# Patient Record
Sex: Male | Born: 1955 | Race: White | Hispanic: No | Marital: Married | Smoking: Never smoker
Health system: Southern US, Community
[De-identification: ages and names within clinical notes are randomized; demographics above are authoritative.]

## PROBLEM LIST (undated history)

## (undated) DIAGNOSIS — I10 Essential (primary) hypertension: Secondary | ICD-10-CM

## (undated) HISTORY — DX: Essential (primary) hypertension: I10

---

## 2004-08-15 ENCOUNTER — Ambulatory Visit: Payer: Self-pay | Admitting: Gastroenterology

## 2004-09-23 ENCOUNTER — Inpatient Hospital Stay (HOSPITAL_COMMUNITY): Admission: RE | Admit: 2004-09-23 | Discharge: 2004-09-25 | Payer: Self-pay | Admitting: Orthopedic Surgery

## 2005-10-06 HISTORY — PX: REPLACEMENT TOTAL KNEE: SUR1224

## 2009-01-30 ENCOUNTER — Ambulatory Visit (HOSPITAL_COMMUNITY): Admission: RE | Admit: 2009-01-30 | Discharge: 2009-01-30 | Payer: Self-pay | Admitting: General Surgery

## 2009-02-15 ENCOUNTER — Encounter: Admission: RE | Admit: 2009-02-15 | Discharge: 2009-02-15 | Payer: Self-pay | Admitting: General Surgery

## 2009-09-18 IMAGING — CR DG CHEST 2V
2 series · 2 of 2 positions shown · non-contrast
Comparison: September 17, 2004

CLINICAL DATA: Obesity; preoperative screening for bariatric
procedure

CHEST - 2 VIEW

[view not recorded (1 of 2)]
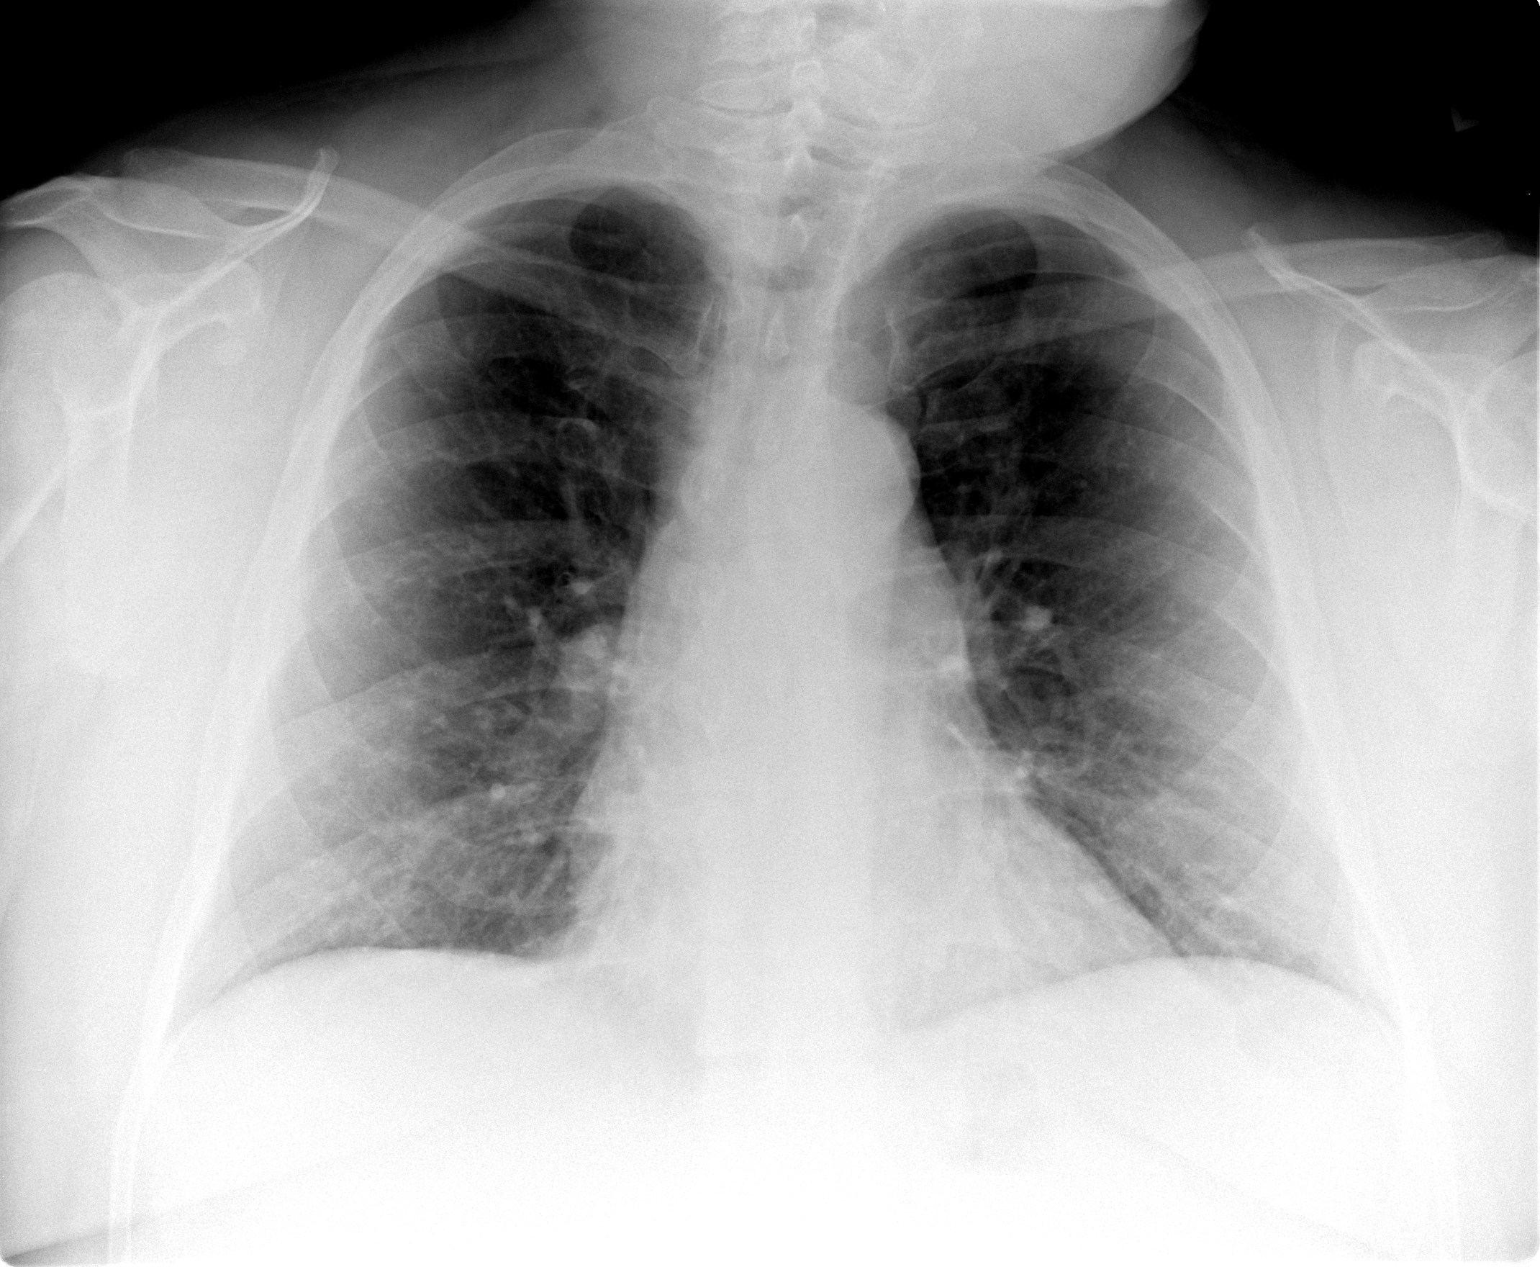

[view not recorded (2 of 2)]
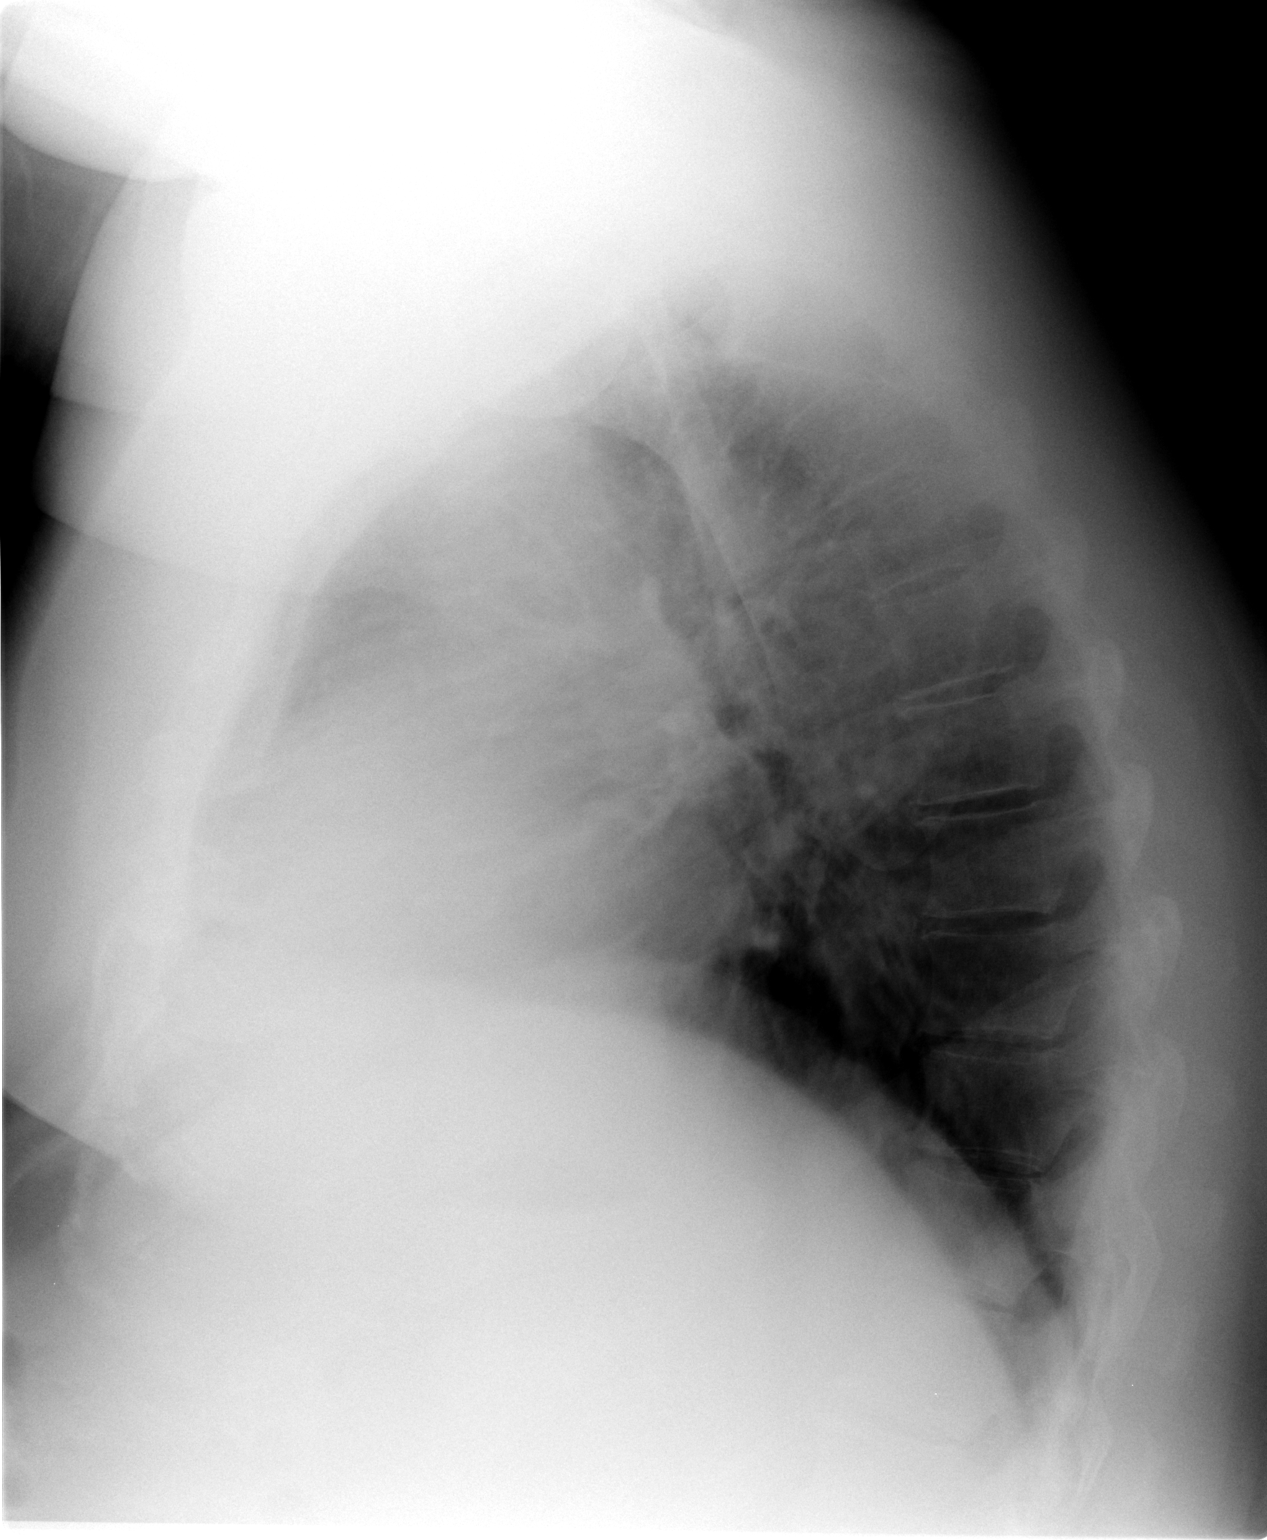

[2 of 2 positions shown; findings below may reference images not displayed]

FINDINGS: The cardiac silhouette, mediastinum, pulmonary
vasculature are within normal limits.  Both lungs are clear.  The
osseous structures are unremarkable.
IMPRESSION: Stable chest x-ray with no evidence of acute cardiac or pulmonary
process.

## 2010-09-05 ENCOUNTER — Encounter
Admission: RE | Admit: 2010-09-05 | Discharge: 2010-11-05 | Payer: Self-pay | Source: Home / Self Care | Attending: General Surgery | Admitting: General Surgery

## 2010-09-17 ENCOUNTER — Ambulatory Visit (HOSPITAL_COMMUNITY)
Admission: RE | Admit: 2010-09-17 | Discharge: 2010-09-17 | Payer: Self-pay | Source: Home / Self Care | Attending: General Surgery | Admitting: General Surgery

## 2010-09-17 HISTORY — PX: LAPAROSCOPIC GASTRIC BANDING: SHX1100

## 2010-09-24 ENCOUNTER — Encounter
Admission: RE | Admit: 2010-09-24 | Discharge: 2010-11-05 | Payer: Self-pay | Source: Home / Self Care | Attending: General Surgery | Admitting: General Surgery

## 2010-11-07 ENCOUNTER — Ambulatory Visit: Payer: Self-pay | Admitting: *Deleted

## 2010-11-18 ENCOUNTER — Encounter: Payer: 59 | Attending: General Surgery | Admitting: *Deleted

## 2010-11-18 DIAGNOSIS — Z09 Encounter for follow-up examination after completed treatment for conditions other than malignant neoplasm: Secondary | ICD-10-CM | POA: Insufficient documentation

## 2010-11-18 DIAGNOSIS — Z713 Dietary counseling and surveillance: Secondary | ICD-10-CM | POA: Insufficient documentation

## 2010-11-18 DIAGNOSIS — Z9884 Bariatric surgery status: Secondary | ICD-10-CM | POA: Insufficient documentation

## 2010-12-17 LAB — DIFFERENTIAL
Basophils Absolute: 0 10*3/uL (ref 0.0–0.1)
Basophils Relative: 0 % (ref 0–1)
Eosinophils Absolute: 0.2 10*3/uL (ref 0.0–0.7)
Eosinophils Relative: 3 % (ref 0–5)
Lymphocytes Relative: 44 % (ref 12–46)
Lymphs Abs: 2.7 10*3/uL (ref 0.7–4.0)
Monocytes Absolute: 0.5 10*3/uL (ref 0.1–1.0)
Monocytes Relative: 8 % (ref 3–12)
Neutro Abs: 2.8 10*3/uL (ref 1.7–7.7)
Neutrophils Relative %: 45 % (ref 43–77)

## 2010-12-17 LAB — COMPREHENSIVE METABOLIC PANEL
ALT: 174 U/L — ABNORMAL HIGH (ref 0–53)
AST: 120 U/L — ABNORMAL HIGH (ref 0–37)
Albumin: 3.8 g/dL (ref 3.5–5.2)
Alkaline Phosphatase: 64 U/L (ref 39–117)
BUN: 13 mg/dL (ref 6–23)
CO2: 30 mEq/L (ref 19–32)
Calcium: 9.2 mg/dL (ref 8.4–10.5)
Chloride: 101 mEq/L (ref 96–112)
Creatinine, Ser: 0.94 mg/dL (ref 0.4–1.5)
GFR calc Af Amer: >60 mL/min (ref >60)
GFR calc non Af Amer: >60 mL/min (ref >60)
Glucose, Bld: 90 mg/dL (ref 70–99)
Potassium: 4.4 mEq/L (ref 3.5–5.1)
Sodium: 140 mEq/L (ref 135–145)
Total Bilirubin: 0.7 mg/dL (ref 0.3–1.2)
Total Protein: 7.4 g/dL (ref 6.0–8.3)

## 2010-12-17 LAB — SURGICAL PCR SCREEN
MRSA, PCR: NEGATIVE
Staphylococcus aureus: NEGATIVE

## 2010-12-17 LAB — CBC
HCT: 43.6 % (ref 39.0–52.0)
Hemoglobin: 14.9 g/dL (ref 13.0–17.0)
MCH: 31.1 pg (ref 26.0–34.0)
MCHC: 34.2 g/dL (ref 30.0–36.0)
MCV: 91 fL (ref 78.0–100.0)
Platelets: 203 10*3/uL (ref 150–400)
RBC: 4.79 MIL/uL (ref 4.22–5.81)
RDW: 13.2 % (ref 11.5–15.5)
WBC: 6.2 10*3/uL (ref 4.0–10.5)

## 2010-12-31 ENCOUNTER — Ambulatory Visit: Payer: Self-pay | Admitting: *Deleted

## 2011-02-21 NOTE — H&P (Signed)
NAME:  Jesse Grant, Jesse Grant             ACCOUNT NO.:  [id number]   MEDICAL RECORD NO.:  [id number]          PATIENT TYPE:  INP   LOCATION:  NA                           FACILITY:  [facility]   PHYSICIAN:  John L. Rendall, M.D.  DATE OF BIRTH:  04-28-1956   DATE OF ADMISSION:  09/23/2004  DATE OF DISCHARGE:                                HISTORY & PHYSICAL   PREOPERATIVE HISTORY AND PHYSICAL   CHIEF COMPLAINT:  Right knee pain for the last 10 years.   HISTORY OF PRESENT ILLNESS:  This 55 year old white male patient presented  to Dr. Priscille Kluver with a greater than 10-year history of intermittent right  knee pain. He has a history of a softball injury to that knee when he was 55  years old, and subsequently he had a right knee arthroscopy in 2003 by Dr.  Ronnell Guadalajara. Over the last several years the pain has been getting  progressively worse.   At this point he has a constant throbbing to sharp sensation over the  superior aspect of the joint with radiation into the proximal thigh. Pain  increases with going down stairs, if he gets up from a sitting to a standing  position, any prolonged walking or turning. The pain decreases with rest.  The knee does feel like it shifts at times; it grinds, catches and swells.  It occasionally keeps him up at night. He has not tried cortisone injections  or Visco supplementation. He does not use any medications for pain, and does  not ambulate with any assistive devices.   ALLERGIES:  No known drug allergies.   CURRENT MEDICATIONS:  None.   PAST MEDICAL HISTORY:  Denies any history of diabetes mellitus,  hypertension, thyroid disease, hiatal hernia, reflux, peptic ulcer disease,  heart disease, asthma, or any other chronic medical conditions.   PAST SURGICAL HISTORY:  1.  Right knee arthroscopy in 2003 by Dr. Ronnell Guadalajara.  2.  Tonsillectomy.   He denies any complications from the above-mentioned procedures.   SOCIAL HISTORY:  He denies any history  of cigarette smoking or drug use. He  drinks alcohol about once a week. He is married and has 3 children. He and  his wife and children live in a 2-story house with 2 steps into the main  entrance. He currently works as a Lobbyist. His medical doctor  is Dr. Thelma Barge P. Modesto Charon, M.D. at [facility] and his telephone  number is [phone].   FAMILY HISTORY:  Mother died at the age of 62 with heart disease and  diabetes. Father is alive at age 79 with a history of a stroke. He has 1  brother alive at age 65 who is alive and well, and his sons are age 21, 65  and 83 and they are all alive and well.   REVIEW OF SYSTEMS:  He does wear glasses for reading. He has had problems  with chronic elevated liver function tests and that has been evaluated  thoroughly by Dr. Melvia Heaps. He will probably have to have a liver  biopsy in the future, but he  has been cleared for surgery. He does not have  a living will nor a power of attorney. All other systems are negative and  noncontributory.   PHYSICAL EXAMINATION:  GENERAL:  A well-developed, well-nourished, mildly  overweight white male in no acute distress. Talks easily with examiner.  Accompanied by his wife. Walks with a slightly antalgic gait. Mood and  affect are appropriate.  VITAL SIGNS:  Height 5 feet 11 inches, weight 295 pounds, BMI is 40.  Temperature 96.9 degrees Fahrenheit, pulse 80, respirations 16, BP 120/70.  HEENT:  Normocephalic, atraumatic without frontal or maxillary sinus  tenderness to palpation. Conjunctivae pink. Sclerae anicteric. PERRLA. EOMs  intact. No visible external ear deformities. Hearing grossly intact.  Tympanic membranes pearly gray bilaterally with good light reflex. Nose and  nasal septum midline. Nasal mucosa pink and moist without exudates or polyps  noted. Buccal mucosa pink and moist. Good dentition. Pharynx without  erythema or exudate. Tongue and uvula midline. Tongue without   fasciculations, and the uvula rises equally with phonation.  NECK:  No visible masses or lesions noted. Trachea midline. No palpable  lymphadenopathy nor thyromegaly. Carotids +2 bilaterally without bruits,  full range of motion, nontender to palpation along the cervical spine.  CARDIOVASCULAR:  Heart rate and rhythm regular. S1 and S2 present without  rubs, clicks or murmurs noted.  RESPIRATORY:  Respirations even and unlabored. Breath sounds clear to  auscultation bilaterally without rales or wheezes noted.  ABDOMEN:  Rounded abdominal contour. Bowel sounds present x4 quadrants.  Soft, nontender to palpation without hepatosplenomegaly nor CVA tenderness.  Femoral pulses +2 bilaterally. Nontender to palpation along the vertebral  column.  BREAST/GENITOURINARY/RECTAL:  These exams are deferred at this time.  MUSCULOSKELETAL:  No obvious deformities of bilateral upper extremities with  full range of motion of these extremities without pain. Radial pulses +2  bilaterally. Full range of motion of his hips, ankles and toes bilaterally.  DP and PT pulses are +2. No lower extremity edema. Negative Homans' sign. No  calf pain with palpation bilaterally. Left knee has full extension and  flexion to 120 degrees without crepitus. Skin is intact without erythema or  ecchymosis. There is no effusion, no pain with palpation along the joint  line. Stable to varus and valgus stress. Negative anterior drawer. Right  knee: Skin is intact without erythema or ecchymosis. Well-healed arthroscopy  portal sites. There is moderate crepitus with range of motion of the right  knee and he has full extension and flexion to 110 degrees. He does have pain  with palpation along the medial joint line. Minimal effusion. Stable to  varus and valgus stress, negative anterior drawer. Leg otherwise  neurovascularly intact. NEUROLOGIC:  Alert and oriented x3. Cranial nerves 2-12 are grossly intact.  Strength 5/5 bilateral  upper and lower extremities. Rapid alternating  movements intact. Deep tendon reflexes 2+ bilateral upper and lower  extremities. Sensation intact to light touch.   RADIOLOGIC FINDINGS:  X-rays taken of his right knee in September of 2005  show almost bone-on-bone contact in the medial compartment of the right knee  with osteophytes along the medial femoral condyle and patella.   IMPRESSION:  1.  End-stage osteoarthritis, right knee.  2.  Obesity.  3.  Chronic elevated liver function tests, negative workup.   PLAN:  Mr. Fjeld will be admitted to [facility] on September 23, 2004, where he will undergo a right total knee arthroplasty by Dr. Jonny Ruiz L.  Rendall. He will  undergo all the routine preoperative laboratory tests and  studies prior to this procedure. If we have any medical issues while he is  hospitalized, we will consult Dr. Modesto Charon or Dr. Arlyce Dice.     Legrand Pitts   KED/MEDQ  D:  09/13/2004  T:  09/13/2004  Job:  [id number]

## 2011-02-21 NOTE — Op Note (Signed)
NAME:  Jesse Grant, Jesse Grant             ACCOUNT NO.:  [id number]   MEDICAL RECORD NO.:  [id number]          PATIENT TYPE:  INP   LOCATION:  X002                         FACILITY:  [facility]   PHYSICIAN:  John L. Rendall III, M.D.DATE OF BIRTH:  [birth year]   DATE OF PROCEDURE:  09/23/2004  DATE OF DISCHARGE:                                 OPERATIVE REPORT   PREOPERATIVE DIAGNOSES:  End-stage osteoarthritis right knee.   POSTOPERATIVE DIAGNOSES:  End-stage osteoarthritis right knee.   PROCEDURE:  Computer navigation assisted right LCS total knee replacement.   SURGEON:  John L. Priscille Kluver, M.D.   ASSISTANT:  Jamelle Rushing, P.A.   ANESTHESIA:  General.   PATHOLOGY:  Bone on bone medial compartment right knee with wear through  other compartments as well. The patient is a heavy white male with knee pain  for 10 years nonresponsive to previous knee scopes and antiinflammatories.  He has constant throbbing and pain in the knee.   DESCRIPTION OF PROCEDURE:  Under general anesthetic, the right knee is  prepared with DuraPrep and draped as a sterile field. A sterile tourniquet  is on the thigh, the leg is wrapped out with an Esmarch and the tourniquet  is used to 350 mm.  A midline incision is made, the patella is everted.  The  shin pins are inserted through punctures in mid tibia region and in the  distal femur within the incision with Schanz pins in place and the knee  debrided of osteophytes.  The computer navigation begins finding first the  center of the hip and then the medial and lateral malleoli then carefully  mapping the outline of the proximal tibia and then the distal femur.  With  the outlines mapped and checked within 0.3 mm tolerance, attention is then  turned to the total knee replacement.  It should be noted the knee was in 4  degrees of varus and 2 degree flexion contracture at the start of the  procedure.  Using the tibial guide, proximal tibial resection is carried out  and  checked with the computer to be within 1 mm of anatomic alignment. The  balancer is then inserted and releases are done on the medial side until the  proximal tibia has full release pass the anserinus MCL sleeve and  posteromedial capsule with spurs taken down.  Once this is done, the  balancer is even at 1 degree varus and felt acceptable.  Attention is then  turned to the flexion gap which is measured. Attention is then turned to the  distal femur, anterior and posterior flair of the distal femur are resected,  proper rotation is obtained in proper alignment. A distal femoral cut is  then obtained and again with a spacer block in place, alignment was within 1  degree of anatomic axis upon completion of filing down the lateral tibia.  With this done, the recessing guide is then used. Following this, the  remnants of the menisci and the cruciates are resected. The Homan and McKale  are inserted, the proximal tibia is exposed, sized at a #3.  A center peg  hole with fins is inserted.  Previously the flexion and extension gap were  checked with a 12.5 block and a 12.5 bearing is then inserted.  The femoral  component is inserted.  With all components in place, we are within 1 degree  of anatomic alignment. The patella is then osteotomized and three peg holes  placed.  Schanz pins are removed, the knee is washed out with pressure  irrigation, permanent components are then put in place with standard plus  femur, standard plus patella, 12.5 bearing and #3 tibia.  With all  components cemented in place, the tourniquet is let down at an hour and 10  minutes.  Multiple small vessels are cauterized, a medium Hemovac is  inserted, excess cement is removed. The knee is then closed in layers with  #1 Tycron, 0 and 2-0 Vicryl and skin clips. At the close of the case, the  patient had a nicely  balanced knee.  It appeared to have correction of varus deformity with  flexion and good stability in both  flexion and extension. A sterile  compression bandage is applied and the patient returned to recovery in good  condition.  He did receive a femoral nerve block just prior to the case.  Will put him in CPM at 90 degrees.     Daisy Blossom  D:  09/23/2004  T:  09/23/2004  Job:  [id number]

## 2011-02-21 NOTE — Discharge Summary (Signed)
NAME:  Jesse Grant, Jesse Grant             ACCOUNT NO.:  [id number]   MEDICAL RECORD NO.:  [id number]          PATIENT TYPE:  INP   LOCATION:  0472                         FACILITY:  [facility]   PHYSICIAN:  John L. Rendall, M.D.  DATE OF BIRTH:  1956-09-04   DATE OF ADMISSION:  09/23/2004  DATE OF DISCHARGE:  09/25/2004                                 DISCHARGE SUMMARY   ADMISSION DIAGNOSES:  1.  End-stage osteoarthritis in the right knee.  2.  Obesity.  3.  Chronic elevated liver function tests, negative workup.   DISCHARGE DIAGNOSES:  1.  Right total knee arthroplasty.  2.  Obesity.  3.  Chronic elevated liver enzymes.   HISTORY OF PRESENT ILLNESS:  Patient is a 55 year old white male who  presents with about at 10-year history gradual onset personally worsening  right knee pain.  He had an injury playing softball at the age of 55 and a  knee arthroscopy in 2003 with some improvement, but the pain has  progressively worsened.  It is now constant.  It is described as a throbbing  diffuse pain about the joint line with occasional radiation up to thigh.  It  worsens with any stairs, walking or turning.  It improves with rest.  He  does have shifting of the knee, grinding, catching, swelling and night pain.   ALLERGIES:  No known drug allergies.   CURRENT MEDICATIONS:  The patient denies any __________ medications.   SURGICAL PROCEDURE:  On September 23, 2004, the patient was taken to the OR  by Jonny Ruiz L. Rendall, M.D., assisted by Jamelle Rushing, P.A.C.  Under general  anesthesia, the patient underwent a right total knee arthroplasty without  any complications.  The patient had the following components implanted.  A  standard plus right femoral component, size 3 keeled tibial tray, standard  plus 12.5 polyethelene bearing, standard plus patella, and all components  were implanted with polymethylmethacrylate.  Patient was transferred to the  recovery room where he was placed in a CPM and then  transferred to the  orthopedic floor for total knee protocol.   CONSULTS:  The following routine consults requested, physical therapy.   HOSPITAL COURSE:  On September 23, 2004, patient was admitted to [facility] under the care of Dr. Jonny Ruiz Rendall.  The patient was taken to the  OR where a right total knee arthroplasty was performed without any  complications.  The patient was transferred to the recovery room where he  was placed in a CPM.  Had his pain well controlled with IV pain medicines.  He was placed on IV antibiotics.  The patient was then transferred to the  floor for total knee protocol, and he was also started on a Arixtra for DVT  prophylaxis.   Over the next 2 days, the patient worked well with physical therapy, and he  had no medical complaints.  His vital signs remained stable.  He remained  afebrile.  His labs remained stable.  Patient was transitioned over from IV  meds to p.o. pain meds with good control, and it was felt on  postop day #2  that patient was eager and medically and orthopedically ready for discharge  home.  Arrangements were made for outpatient physical therapy, so he was  discharged in good condition.   LABS:  CBC on September 25, 2004: WBC is 9.1, hemoglobin 12.6, hematocrit  37.0, platelets 182.  Routine chemistries on September 25, 2004: Sodium of  134, felt to be hydration and is self correct, glucose 111, felt to be  postoperative stress and inactivity, BUN was 9, creatinine was 1.1.  Routine  urinalysis on admission was normal with the exception of a small hemoglobin.   MEDICATIONS UPON DISCHARGE FROM ORTHO FLOOR:  1.  Colace 100 mg p.o. b.i.d.  2 Trinsicon 1 tablet p.o. t.i.d.  3. Arixtra      2.5 mg subcu. q.day.  4. Percocet 1 or 2 tablets q.4-6h. p.r.n.  5.      Robaxin 500 mg p.o. q.6h. p.r.n.  6. Tylenol 650 mg p.o. q.6h. p.r.n.      7. Restoril 30 mg p.o. q.h.s. p.r.n.  8. OxyContin CR 20 mg p.o. q.12h.   DISCHARGE  INSTRUCTIONS:   MEDICATIONS:  The patient is to take the following medications.  1.  OxyContin CR 10 mg tablets, 2 tablets q.12h. for the first 7 days and      then 1 tablet q.12h. until gone.  2.  Percocet 1 or 2 tablets q.4-6h. hours for break-through pain if needed.  3.  Arixtra 1 injection a day for the next 5 days and then take aspirin for      one month.   ACTIVITY:  As tolerated with use of a walker.   DIET:  No restrictions.   WOUND CARE:  The patient is to keep wound clean and dry.  If any concerns,  __________ or any other problems, patient is to call Dr. Priscille Kluver office for  advice.   FOLLOWUP:  Call [phone] for a follow-up appointment with Dr. Priscille Kluver in 2  weeks.   Patient's condition upon discharge is listed as improved to good.      RWK/MEDQ  D:  10/23/2004  T:  10/23/2004  Job:  [id number]

## 2011-05-01 ENCOUNTER — Ambulatory Visit (INDEPENDENT_AMBULATORY_CARE_PROVIDER_SITE_OTHER): Payer: 59 | Admitting: General Surgery

## 2011-05-01 ENCOUNTER — Encounter (INDEPENDENT_AMBULATORY_CARE_PROVIDER_SITE_OTHER): Payer: Self-pay | Admitting: General Surgery

## 2011-05-01 DIAGNOSIS — Z4651 Encounter for fitting and adjustment of gastric lap band: Secondary | ICD-10-CM
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
## 2011-05-01 NOTE — Patient Instructions (Signed)
Exercise for 40 minutes at least 4 times per week. Concentrate on small bites and eating slowly. Resume your diet cautiously after the fill.

## 2011-05-06 IMAGING — CR DG ABDOMEN 1V
2 series · 2 of 2 positions shown · non-contrast
Comparison: None.

CLINICAL DATA: Postop gastric banding.

ABDOMEN - 1 VIEW 09/17/2010:

[t abdomen supine (1 of 2)]
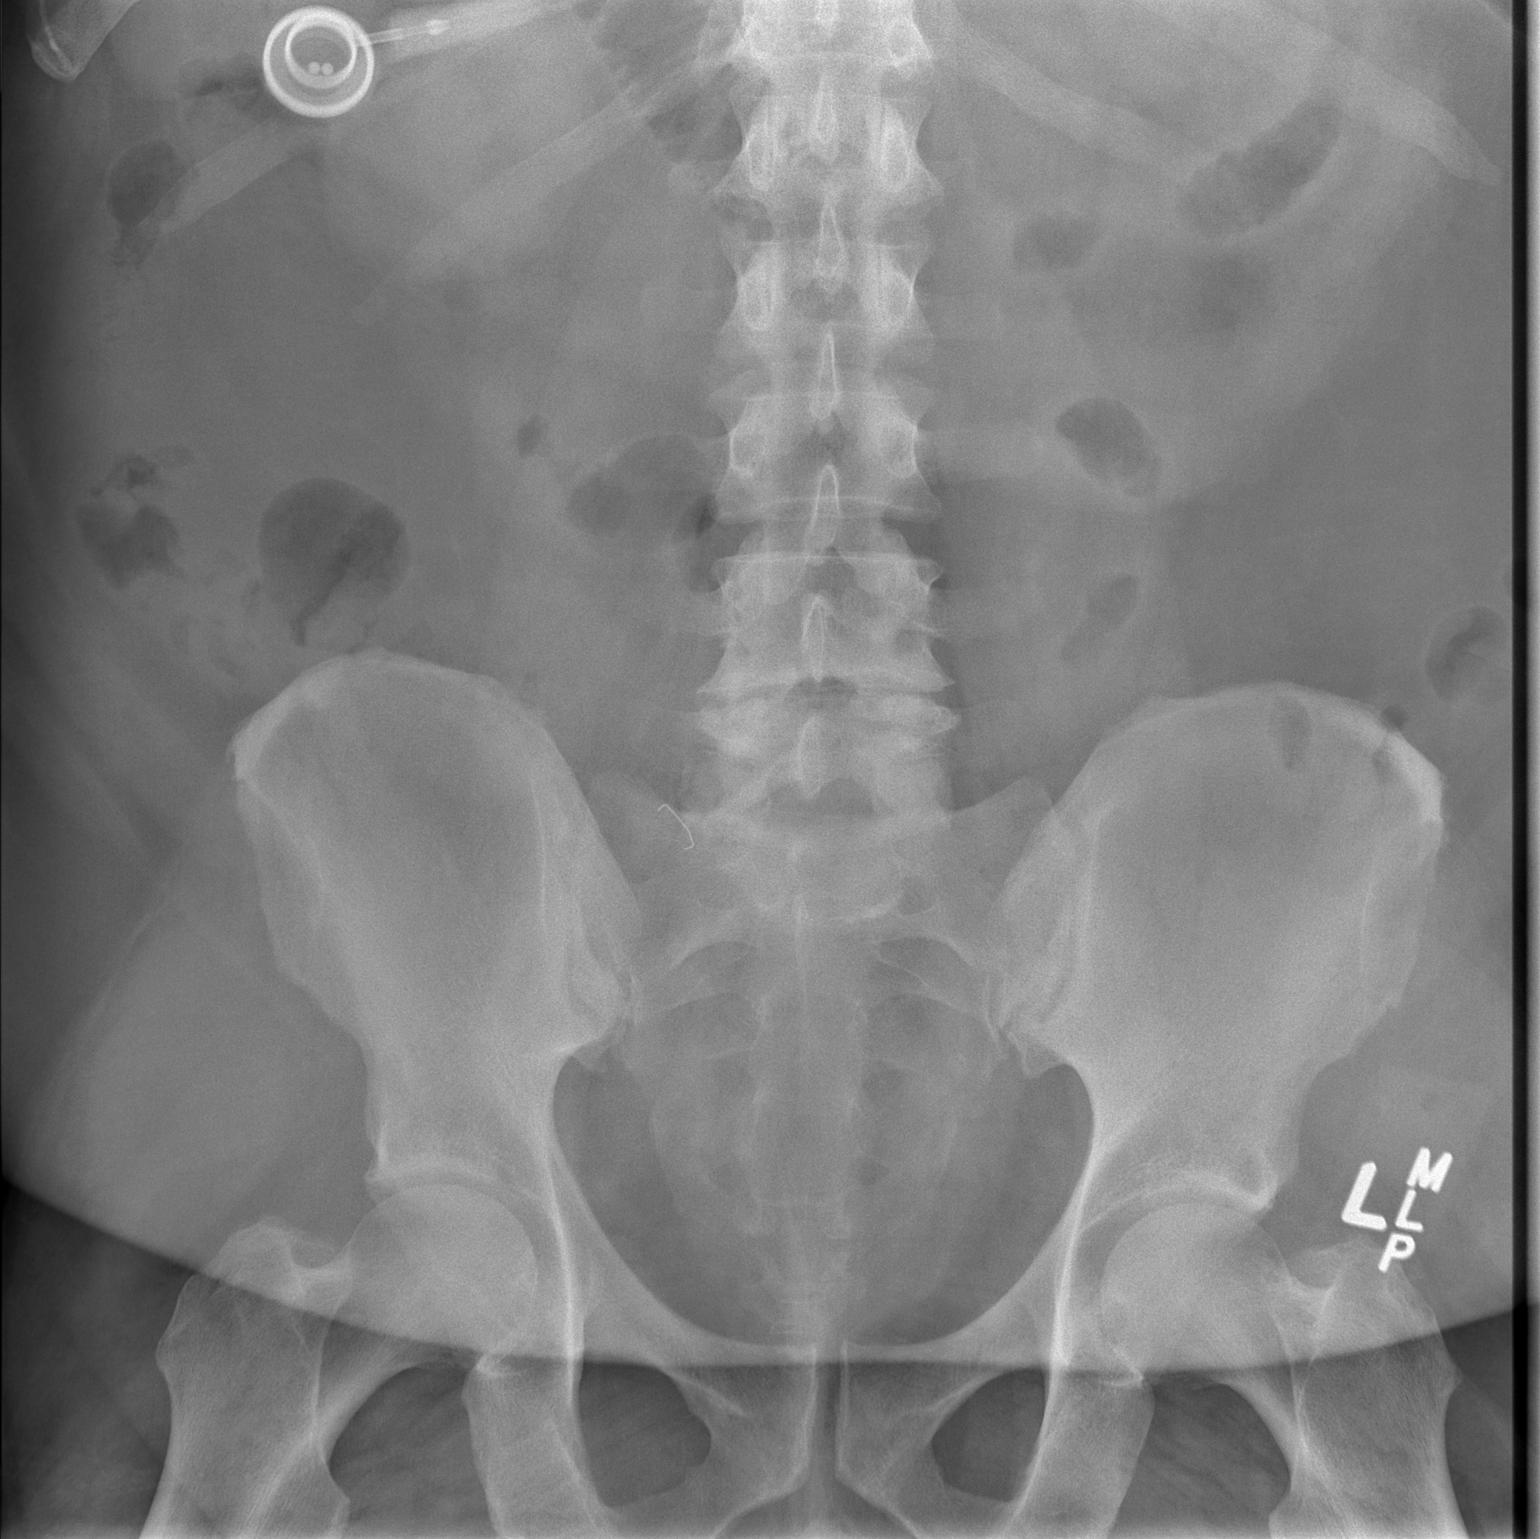

[t abdomen supine (2 of 2)]
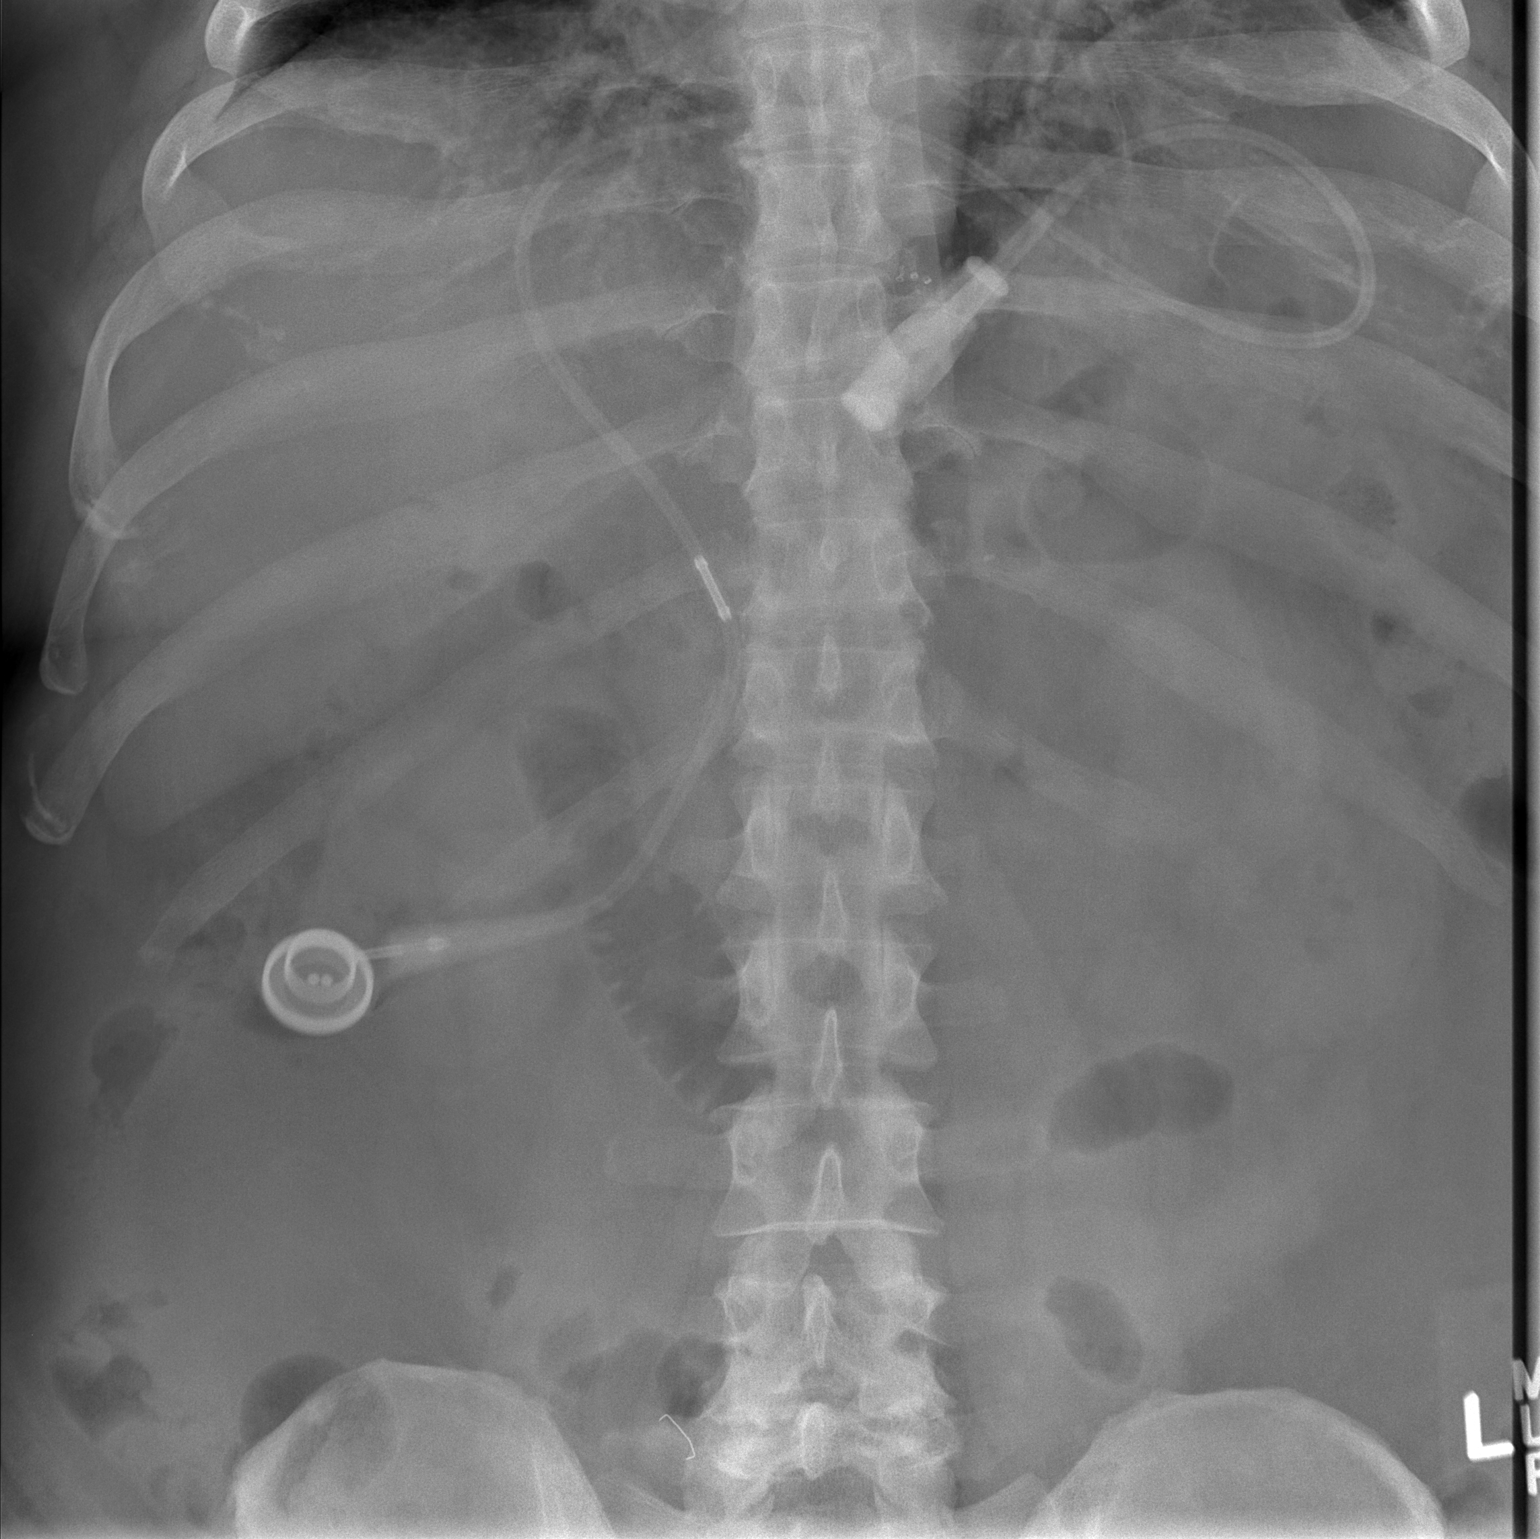

[2 of 2 positions shown; findings below may reference images not displayed]

FINDINGS: Laparoscopic band appropriately oriented in the 2 o'clock
to 8 o'clock position.  Port positioned in the right upper quadrant
with intact subcutaneous tubing.  Bowel gas pattern unremarkable.
No suggestion of free intraperitoneal air.  No abnormal
calcifications.  Regional skeleton unremarkable.  Staple overlying
the right upper pelvis, likely at the site of the port for the
laparoscopic procedure.
IMPRESSION: Laparoscopic band appropriately positioned without acute
complicating features.  No acute abdominal abnormality.

## 2011-06-04 ENCOUNTER — Encounter (INDEPENDENT_AMBULATORY_CARE_PROVIDER_SITE_OTHER): Payer: 59 | Admitting: General Surgery

## 2011-07-04 ENCOUNTER — Encounter (INDEPENDENT_AMBULATORY_CARE_PROVIDER_SITE_OTHER): Payer: 59 | Admitting: General Surgery

## 2012-06-28 ENCOUNTER — Telehealth (INDEPENDENT_AMBULATORY_CARE_PROVIDER_SITE_OTHER): Payer: Self-pay | Admitting: General Surgery

## 2012-06-28 NOTE — Telephone Encounter (Signed)
I called the patient to schedule his annual follow-up appointment for lap band surgery and his wife said he would have to call [facility] back to schedule as he was on another line...cef

## 2013-11-01 ENCOUNTER — Other Ambulatory Visit: Payer: Self-pay | Admitting: Family Medicine

## 2013-11-01 DIAGNOSIS — R748 Abnormal levels of other serum enzymes: Secondary | ICD-10-CM

## 2013-11-07 ENCOUNTER — Ambulatory Visit
Admission: RE | Admit: 2013-11-07 | Discharge: 2013-11-07 | Disposition: A | Discharge status: Home / Self Care | Payer: 59 | Source: Ambulatory Visit | Attending: Family Medicine | Admitting: Family Medicine

## 2013-11-07 DIAGNOSIS — R748 Abnormal levels of other serum enzymes: Secondary | ICD-10-CM

## 2014-02-08 ENCOUNTER — Telehealth (HOSPITAL_COMMUNITY): Payer: Self-pay

## 2014-02-08 NOTE — Telephone Encounter (Signed)
This patient is overdue for recommended follow-up with a bariatric surgeon at [facility]. [facility] today [facility]. Patient was agreeable to scheduling an upcoming appointment today, but the call went to voicemail when I attempted a direct transfer to the scheduler at [facility]. Provided patient with contact # & he confirmed that he would call [phone] himself to schedule an appointment for sometime in June once he returns from vacation. Routing message to [employer] [employer] [facility] so they can follow-up with patient.   Jim LikeAmanda T. [facility] Coordinator [phone]

## 2014-06-26 IMAGING — US US ABDOMEN COMPLETE
1 series · 14 of 25 positions shown · non-contrast
Comparison: None.

CLINICAL DATA: Elevated liver enzymes

EXAM:
ULTRASOUND ABDOMEN COMPLETE

[Series 1: us abdomen complete · 0.33mm/px · 14 of 56 slices shown]
[im 1/56]
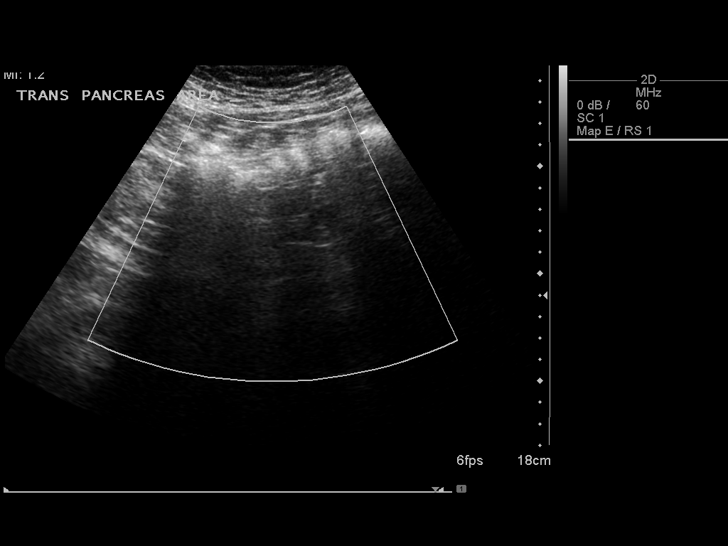
[im 5/56]
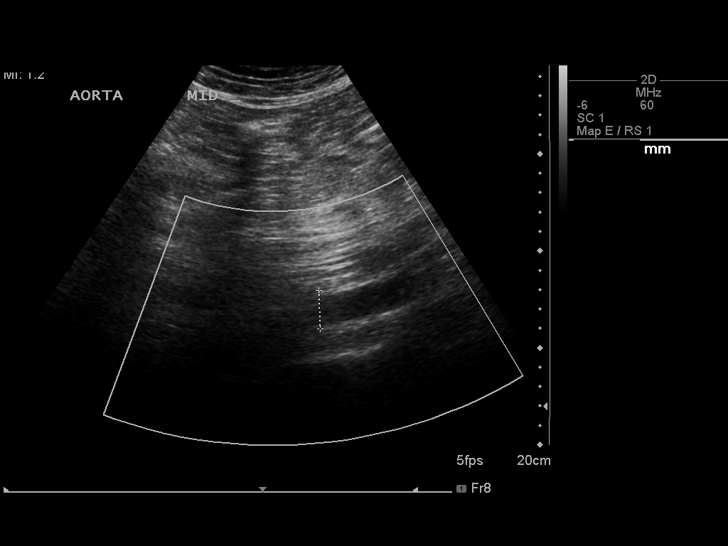
[im 10/56]
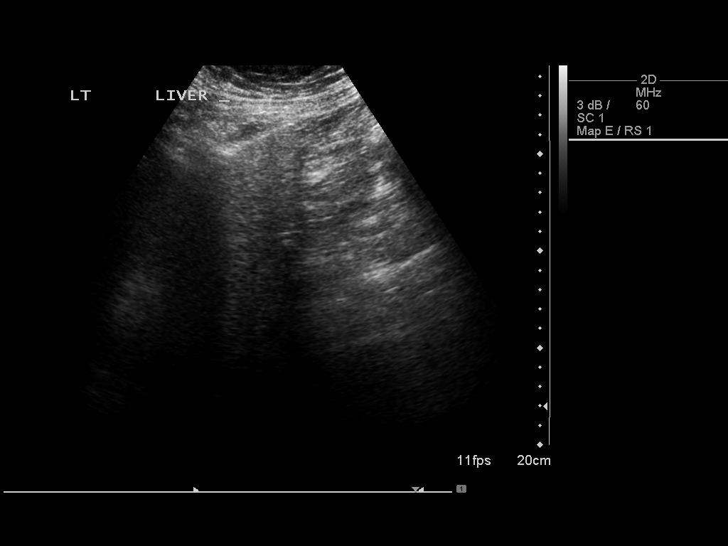
[im 14/56]
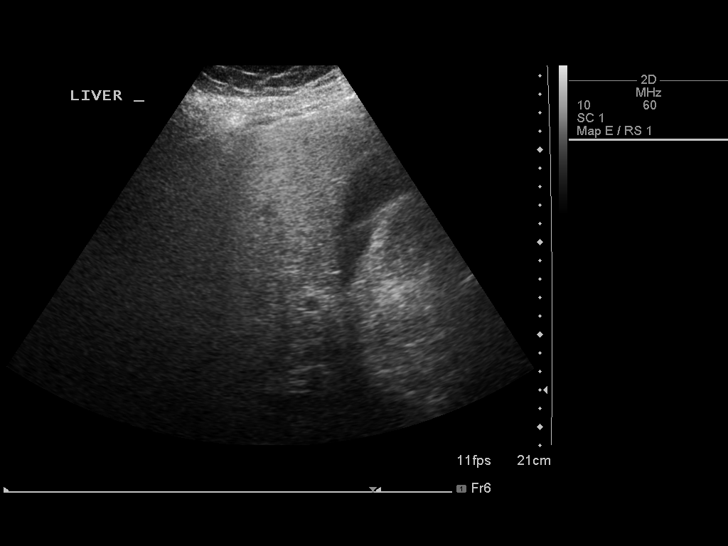
[im 19/56]
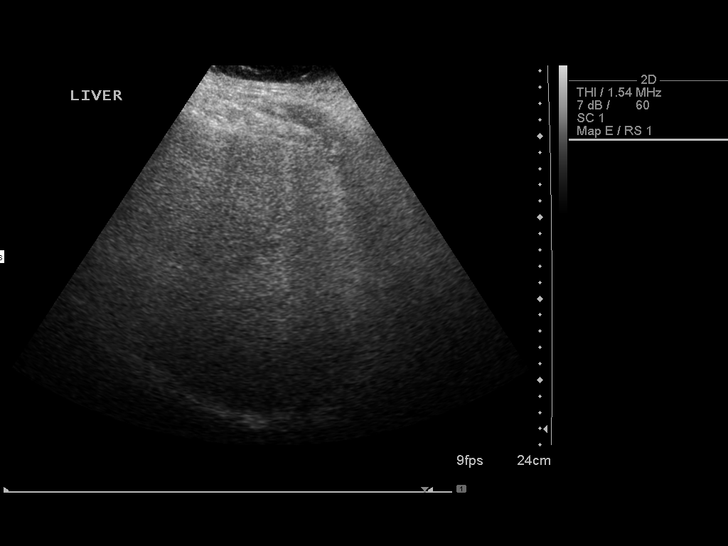
[im 21/56]
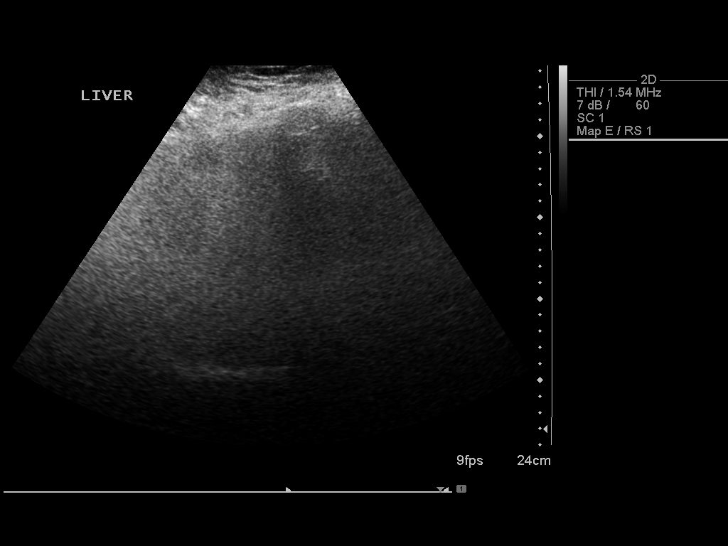
[im 26/56]
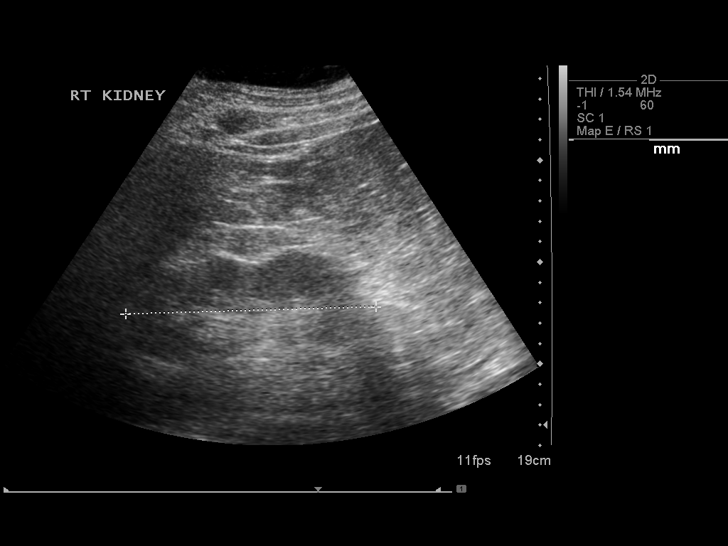
[im 30/56]
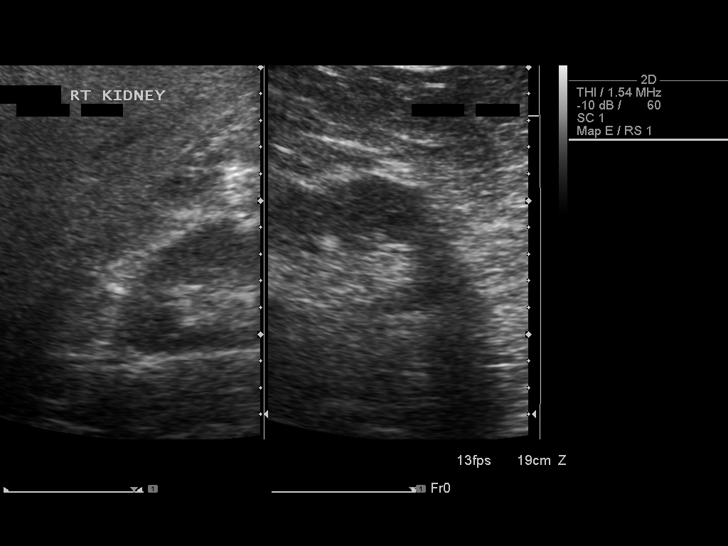
[im 35/56]
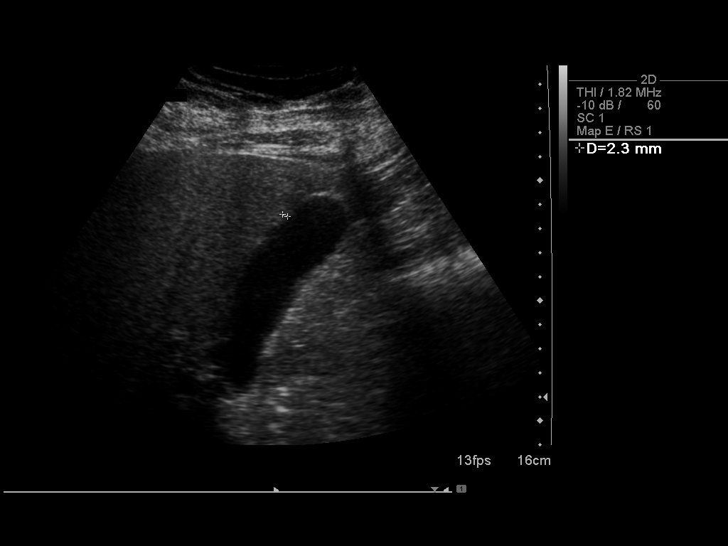
[im 37/56]
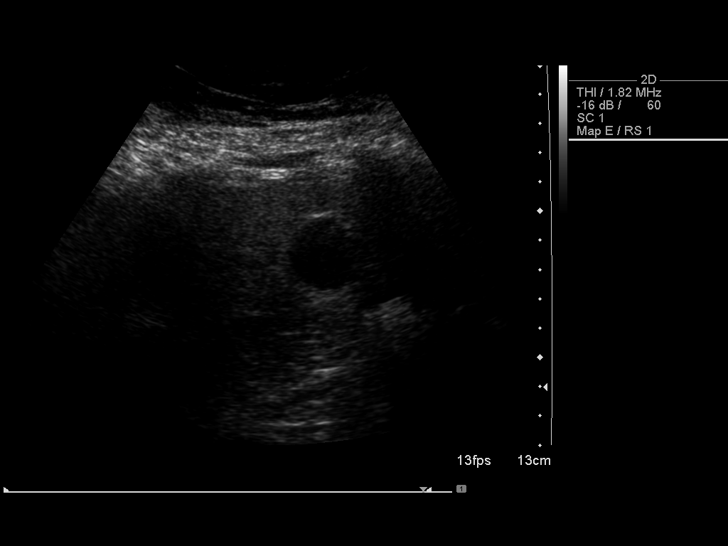
[im 42/56]
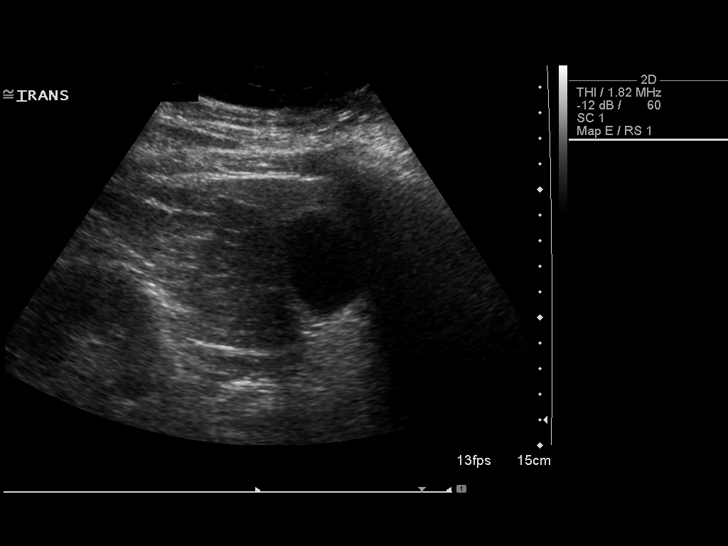
[im 46/56]
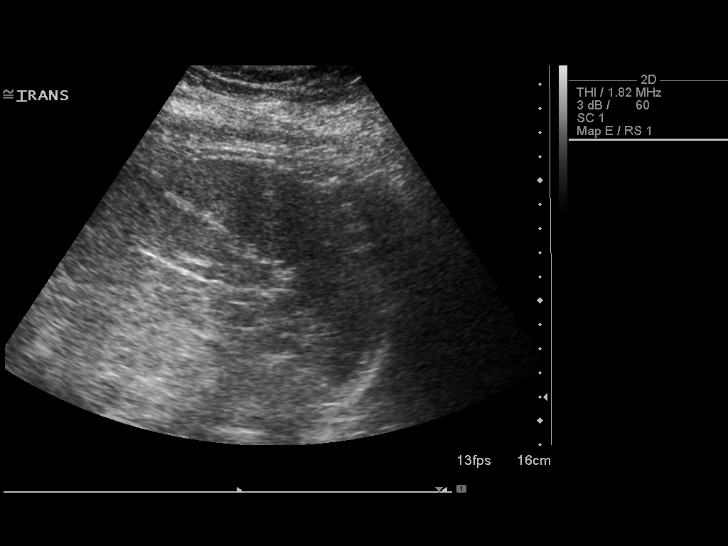
[im 51/56]
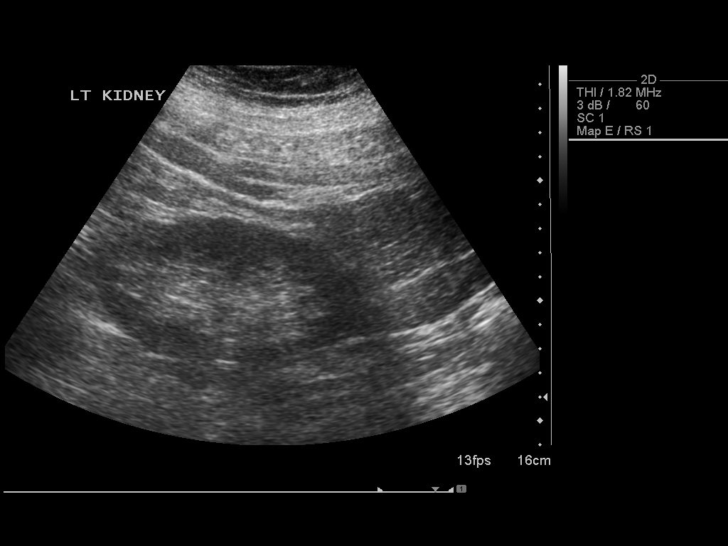
[im 56/56]
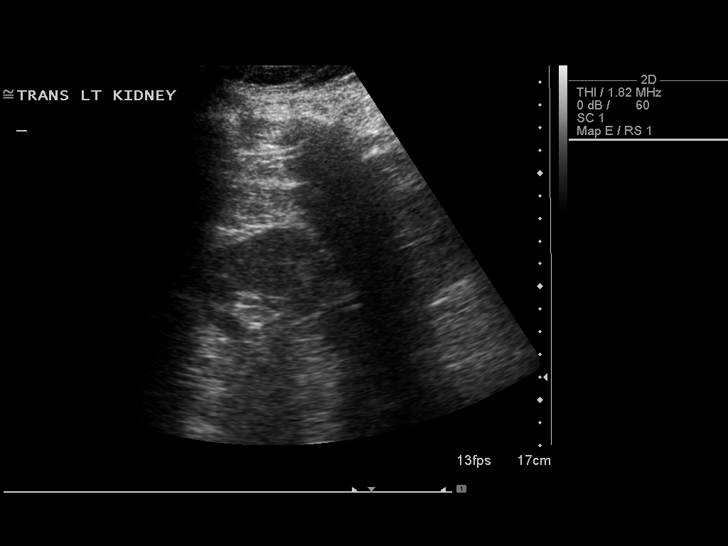

[14 of 25 positions shown; findings below may reference images not displayed]

FINDINGS: Gallbladder:

No gallstones or wall thickening visualized. No sonographic Murphy
sign noted.

Common bile duct:

Diameter: 3.1 mm

Liver:

Increased parenchymal echogenicity suggestive of hepatic steatosis.
No focal mass lesion identified.

IVC:

No abnormality visualized.

Pancreas:

Visualized portion unremarkable.

Spleen:

Size and appearance within normal limits.

Right Kidney:

Length: 12.3 cm. Echogenicity within normal limits. No mass or
hydronephrosis visualized.

Left Kidney:

Length: 11.5 cm. Echogenicity within normal limits. No mass or
hydronephrosis visualized.

Abdominal aorta:

No aneurysm visualized.

Other findings:

None.
IMPRESSION: 1. Hepatic steatosis.
2. No acute findings noted.

## 2016-11-20 DIAGNOSIS — E7800 Pure hypercholesterolemia, unspecified: Secondary | ICD-10-CM | POA: Diagnosis not present

## 2016-11-20 DIAGNOSIS — Z0000 Encounter for general adult medical examination without abnormal findings: Secondary | ICD-10-CM | POA: Diagnosis not present

## 2017-01-19 DIAGNOSIS — K760 Fatty (change of) liver, not elsewhere classified: Secondary | ICD-10-CM | POA: Diagnosis not present

## 2017-01-19 DIAGNOSIS — R739 Hyperglycemia, unspecified: Secondary | ICD-10-CM | POA: Diagnosis not present

## 2017-01-19 DIAGNOSIS — I10 Essential (primary) hypertension: Secondary | ICD-10-CM | POA: Diagnosis not present

## 2017-04-28 ENCOUNTER — Encounter (HOSPITAL_COMMUNITY): Payer: Self-pay

## 2017-11-27 DIAGNOSIS — K760 Fatty (change of) liver, not elsewhere classified: Secondary | ICD-10-CM | POA: Diagnosis not present

## 2017-11-27 DIAGNOSIS — Z125 Encounter for screening for malignant neoplasm of prostate: Secondary | ICD-10-CM | POA: Diagnosis not present

## 2017-11-27 DIAGNOSIS — Z0001 Encounter for general adult medical examination with abnormal findings: Secondary | ICD-10-CM | POA: Diagnosis not present

## 2018-03-12 DIAGNOSIS — J209 Acute bronchitis, unspecified: Secondary | ICD-10-CM | POA: Diagnosis not present

## 2018-03-12 DIAGNOSIS — R05 Cough: Secondary | ICD-10-CM | POA: Diagnosis not present

## 2018-03-12 DIAGNOSIS — R509 Fever, unspecified: Secondary | ICD-10-CM | POA: Diagnosis not present

## 2018-11-29 DIAGNOSIS — Z1159 Encounter for screening for other viral diseases: Secondary | ICD-10-CM | POA: Diagnosis not present

## 2018-11-29 DIAGNOSIS — R739 Hyperglycemia, unspecified: Secondary | ICD-10-CM | POA: Diagnosis not present

## 2018-11-29 DIAGNOSIS — K760 Fatty (change of) liver, not elsewhere classified: Secondary | ICD-10-CM | POA: Diagnosis not present

## 2018-11-29 DIAGNOSIS — Z125 Encounter for screening for malignant neoplasm of prostate: Secondary | ICD-10-CM | POA: Diagnosis not present

## 2018-11-29 DIAGNOSIS — Z0001 Encounter for general adult medical examination with abnormal findings: Secondary | ICD-10-CM | POA: Diagnosis not present
